# Patient Record
Sex: Female | Born: 1995 | Race: White | Hispanic: No | Marital: Single
Health system: Southern US, Community
[De-identification: ages and names within clinical notes are randomized; demographics above are authoritative.]

---
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
## 2020-02-29 ENCOUNTER — Emergency Department: Payer: Managed Care, Other (non HMO)

## 2020-02-29 ENCOUNTER — Other Ambulatory Visit: Payer: Self-pay

## 2020-02-29 ENCOUNTER — Emergency Department
Admission: EM | Admit: 2020-02-29 | Discharge: 2020-02-29 | Disposition: A | Discharge status: Home / Self Care | Payer: Managed Care, Other (non HMO) | Attending: Emergency Medicine | Admitting: Emergency Medicine

## 2020-02-29 DIAGNOSIS — S8011XA Contusion of right lower leg, initial encounter: Secondary | ICD-10-CM | POA: Insufficient documentation

## 2020-02-29 DIAGNOSIS — S80211A Abrasion, right knee, initial encounter: Secondary | ICD-10-CM | POA: Insufficient documentation

## 2020-02-29 DIAGNOSIS — Y998 Other external cause status: Secondary | ICD-10-CM | POA: Diagnosis not present

## 2020-02-29 DIAGNOSIS — Y9389 Activity, other specified: Secondary | ICD-10-CM | POA: Diagnosis not present

## 2020-02-29 DIAGNOSIS — S8991XA Unspecified injury of right lower leg, initial encounter: Secondary | ICD-10-CM | POA: Diagnosis present

## 2020-02-29 DIAGNOSIS — Y92838 Other recreation area as the place of occurrence of the external cause: Secondary | ICD-10-CM | POA: Diagnosis not present

## 2020-02-29 MED ORDER — ACETAMINOPHEN 325 MG PO TABS
650.0000 mg | ORAL_TABLET | Freq: Once | ORAL | Status: AC
  Administered 2020-02-29: 650 mg via ORAL
  Filled 2020-02-29: qty 2

## 2020-02-29 MED ORDER — MELOXICAM 7.5 MG PO TABS
15.0000 mg | ORAL_TABLET | Freq: Once | ORAL | Status: AC
  Administered 2020-02-29: 15 mg via ORAL
  Filled 2020-02-29: qty 2

## 2020-02-29 MED ORDER — MELOXICAM 15 MG PO TABS: 15.0000 mg | ORAL_TABLET | Freq: Every day | ORAL | 0 refills | Status: AC

## 2020-02-29 MED ORDER — CYCLOBENZAPRINE HCL 5 MG PO TABS: 5.0000 mg | ORAL_TABLET | Freq: Three times a day (TID) | ORAL | 0 refills | Status: AC | PRN
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
